# Patient Record
Sex: Male | Born: 1985
Health system: Southern US, Community
[De-identification: ages and names within clinical notes are randomized; demographics above are authoritative.]

## PROBLEM LIST (undated history)

## (undated) DIAGNOSIS — E669 Obesity, unspecified: Secondary | ICD-10-CM

## (undated) DIAGNOSIS — I10 Essential (primary) hypertension: Secondary | ICD-10-CM

---

## 2011-06-16 ENCOUNTER — Emergency Department (HOSPITAL_COMMUNITY)
Admission: EM | Admit: 2011-06-16 | Discharge: 2011-06-16 | Disposition: A | Discharge status: Home / Self Care | Payer: PRIVATE HEALTH INSURANCE | Attending: Emergency Medicine | Admitting: Emergency Medicine

## 2011-06-16 DIAGNOSIS — R51 Headache: Secondary | ICD-10-CM | POA: Insufficient documentation

## 2011-06-16 DIAGNOSIS — R509 Fever, unspecified: Secondary | ICD-10-CM | POA: Insufficient documentation

## 2011-06-16 DIAGNOSIS — J351 Hypertrophy of tonsils: Secondary | ICD-10-CM | POA: Insufficient documentation

## 2011-06-16 DIAGNOSIS — H9209 Otalgia, unspecified ear: Secondary | ICD-10-CM | POA: Insufficient documentation

## 2011-06-16 DIAGNOSIS — R110 Nausea: Secondary | ICD-10-CM | POA: Insufficient documentation

## 2011-06-16 DIAGNOSIS — E669 Obesity, unspecified: Secondary | ICD-10-CM | POA: Insufficient documentation

## 2011-06-16 DIAGNOSIS — R070 Pain in throat: Secondary | ICD-10-CM | POA: Insufficient documentation

## 2011-06-16 DIAGNOSIS — Z79899 Other long term (current) drug therapy: Secondary | ICD-10-CM | POA: Insufficient documentation

## 2011-06-16 DIAGNOSIS — R599 Enlarged lymph nodes, unspecified: Secondary | ICD-10-CM | POA: Insufficient documentation

## 2011-06-16 LAB — RAPID STREP SCREEN (MED CTR MEBANE ONLY): Streptococcus, Group A Screen (Direct): NEGATIVE

## 2013-02-01 ENCOUNTER — Emergency Department (HOSPITAL_COMMUNITY)
Admission: EM | Admit: 2013-02-01 | Discharge: 2013-02-02 | Disposition: A | Discharge status: Home / Self Care | Payer: 59 | Attending: Emergency Medicine | Admitting: Emergency Medicine

## 2013-02-01 ENCOUNTER — Encounter (HOSPITAL_COMMUNITY): Payer: Self-pay | Admitting: Emergency Medicine

## 2013-02-01 DIAGNOSIS — R319 Hematuria, unspecified: Secondary | ICD-10-CM

## 2013-02-01 DIAGNOSIS — F17200 Nicotine dependence, unspecified, uncomplicated: Secondary | ICD-10-CM | POA: Insufficient documentation

## 2013-02-01 LAB — URINE MICROSCOPIC-ADD ON

## 2013-02-01 LAB — URINALYSIS, ROUTINE W REFLEX MICROSCOPIC
Leukocytes, UA: NEGATIVE
Protein, ur: 30 mg/dL — AB
Urobilinogen, UA: 1 mg/dL (ref 0.0–1.0)
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
## 2013-02-02 MED ORDER — CIPROFLOXACIN HCL 500 MG PO TABS: 500.0000 mg | ORAL_TABLET | Freq: Two times a day (BID) | ORAL | Status: DC

## 2013-02-02 MED ORDER — CIPROFLOXACIN HCL 500 MG PO TABS
500.0000 mg | ORAL_TABLET | Freq: Once | ORAL | Status: AC
  Administered 2013-02-02: 500 mg via ORAL
  Filled 2013-02-02: qty 1

## 2013-02-02 NOTE — ED Provider Notes (Signed)
History     CSN: [id number]  Arrival date & time 02/01/13  2143   First MD Initiated Contact with Patient 02/02/13 0008      Chief Complaint  Patient presents with  . Hematuria    (Consider location/radiation/quality/duration/timing/severity/associated sxs/prior treatment) Patient is a 27 y.o. male presenting with hematuria. The history is provided by the patient (pt complains of blood in his urine). No language interpreter was used.  Hematuria This is a new problem. The current episode started 3 to 5 hours ago. The problem occurs constantly. The problem has not changed since onset.Pertinent negatives include no chest pain, no abdominal pain and no headaches. Nothing aggravates the symptoms. Nothing relieves the symptoms.    History reviewed. No pertinent past medical history.  History reviewed. No pertinent past surgical history.  No family history on file.  History  Substance Use Topics  . Smoking status: Current Every Day Smoker  . Smokeless tobacco: Not on file  . Alcohol Use: Yes      Review of Systems  Constitutional: Negative for appetite change and fatigue.  HENT: Negative for congestion, sinus pressure and ear discharge.   Eyes: Negative for discharge.  Respiratory: Negative for cough.   Cardiovascular: Negative for chest pain.  Gastrointestinal: Negative for abdominal pain and diarrhea.  Genitourinary: Positive for hematuria. Negative for frequency.  Musculoskeletal: Negative for back pain.  Skin: Negative for rash.  Neurological: Negative for seizures and headaches.  Psychiatric/Behavioral: Negative for hallucinations.    Allergies  Aspirin  Home Medications   Current Outpatient Rx  Name  Route  Sig  Dispense  Refill  . ciprofloxacin (CIPRO) 500 MG tablet   Oral   Take 1 tablet (500 mg total) by mouth 2 (two) times daily. One po bid x 7 days   14 tablet   0     BP 186/79  Pulse 83  Temp(Src) 99.5 F (37.5 C) (Oral)  Resp 16  SpO2  98%  Physical Exam  Constitutional: He is oriented to person, place, and time. He appears well-developed.  HENT:  Head: Normocephalic.  Eyes: Conjunctivae and EOM are normal. No scleral icterus.  Neck: Neck supple. No thyromegaly present.  Cardiovascular: Normal rate and regular rhythm.  Exam reveals no gallop and no friction rub.   No murmur heard. Pulmonary/Chest: No stridor. He has no wheezes. He has no rales. He exhibits no tenderness.  Abdominal: He exhibits no distension. There is no tenderness. There is no rebound.  Musculoskeletal: Normal range of motion. He exhibits no edema.  Lymphadenopathy:    He has no cervical adenopathy.  Neurological: He is oriented to person, place, and time. Coordination normal.  Skin: No rash noted. No erythema.  Psychiatric: He has a normal mood and affect. His behavior is normal.    ED Course  Procedures (including critical care time)  Labs Reviewed  URINALYSIS, ROUTINE W REFLEX MICROSCOPIC - Abnormal; Notable for the following:    APPearance HAZY (*)    Hgb urine dipstick LARGE (*)    Ketones, ur 15 (*)    Protein, ur 30 (*)    All other components within normal limits  URINE CULTURE  URINE MICROSCOPIC-ADD ON   No results found.   1. Hematuria       MDM          Benny Lennert, MD 02/02/13 [phone]

## 2013-02-03 LAB — URINE CULTURE

## 2013-02-04 ENCOUNTER — Telehealth (HOSPITAL_COMMUNITY): Payer: Self-pay | Admitting: Emergency Medicine

## 2013-02-04 NOTE — ED Notes (Signed)
Patient has +Urine culture. °

## 2013-02-04 NOTE — ED Notes (Signed)
+  Urine. Patient given Cipro. No sensitivities listed. Chart sent to EDP office for review. °

## 2013-02-06 NOTE — ED Notes (Signed)
Chart returned from EDP office . No abx needed per Neta Ehlers

## 2013-07-14 ENCOUNTER — Encounter (HOSPITAL_COMMUNITY): Payer: Self-pay | Admitting: Emergency Medicine

## 2013-07-14 ENCOUNTER — Emergency Department (HOSPITAL_COMMUNITY)
Admission: EM | Admit: 2013-07-14 | Discharge: 2013-07-14 | Disposition: A | Discharge status: Home / Self Care | Payer: 59 | Source: Home / Self Care | Attending: Family Medicine | Admitting: Family Medicine

## 2013-07-14 DIAGNOSIS — F1010 Alcohol abuse, uncomplicated: Secondary | ICD-10-CM
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
## 2013-07-14 NOTE — ED Provider Notes (Signed)
Medical screening examination/treatment/procedure(s) were performed by non-physician practitioner and as supervising physician I was immediately available for consultation/collaboration.  Leslee Home, M.D.  Reuben Likes, MD 07/14/13 [phone]

## 2013-07-14 NOTE — ED Notes (Signed)
Dr. Mat Carne has seen pt and was asked to d/c pt since nurse was busy.

## 2013-07-14 NOTE — ED Notes (Signed)
Pt. was given a Rx. To get UDS. Pt. works for [employer] and can get it done there for free.
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
## 2016-02-17 DIAGNOSIS — I10 Essential (primary) hypertension: Secondary | ICD-10-CM | POA: Insufficient documentation

## 2017-04-02 ENCOUNTER — Encounter (HOSPITAL_COMMUNITY): Payer: Self-pay | Admitting: Emergency Medicine

## 2017-04-02 ENCOUNTER — Ambulatory Visit (HOSPITAL_COMMUNITY)
Admission: EM | Admit: 2017-04-02 | Discharge: 2017-04-02 | Disposition: A | Discharge status: Home / Self Care | Payer: BLUE CROSS/BLUE SHIELD | Attending: Family Medicine | Admitting: Family Medicine

## 2017-04-02 DIAGNOSIS — T2691XA Corrosion of right eye and adnexa, part unspecified, initial encounter: Secondary | ICD-10-CM

## 2017-04-02 DIAGNOSIS — Z77098 Contact with and (suspected) exposure to other hazardous, chiefly nonmedicinal, chemicals: Secondary | ICD-10-CM | POA: Diagnosis not present

## 2017-04-02 HISTORY — DX: Essential (primary) hypertension: I10
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
## 2017-04-02 NOTE — Discharge Instructions (Signed)
Monitor closely over the next 24 hours. If you experience significant eye swelling, eye pain, and/or visual changes please return here or to the Emergency Department promptly.
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
## 2017-08-11 DIAGNOSIS — F4322 Adjustment disorder with anxiety: Secondary | ICD-10-CM | POA: Insufficient documentation

## 2018-09-12 DIAGNOSIS — G4733 Obstructive sleep apnea (adult) (pediatric): Secondary | ICD-10-CM | POA: Insufficient documentation

## 2018-09-12 DIAGNOSIS — Z6841 Body Mass Index (BMI) 40.0 and over, adult: Secondary | ICD-10-CM | POA: Insufficient documentation

## 2018-12-15 DIAGNOSIS — E8881 Metabolic syndrome: Secondary | ICD-10-CM | POA: Insufficient documentation

## 2019-05-27 ENCOUNTER — Ambulatory Visit (HOSPITAL_COMMUNITY)
Admission: EM | Admit: 2019-05-27 | Discharge: 2019-05-27 | Disposition: A | Discharge status: Home / Self Care | Payer: BC Managed Care – PPO | Attending: Family Medicine | Admitting: Family Medicine

## 2019-05-27 ENCOUNTER — Other Ambulatory Visit: Payer: Self-pay

## 2019-05-27 ENCOUNTER — Encounter (HOSPITAL_COMMUNITY): Payer: Self-pay

## 2019-05-27 DIAGNOSIS — S91111A Laceration without foreign body of right great toe without damage to nail, initial encounter: Secondary | ICD-10-CM

## 2019-05-27 MED ORDER — DICLOFENAC SODIUM 50 MG PO TBEC: 50.0000 mg | DELAYED_RELEASE_TABLET | Freq: Two times a day (BID) | ORAL | 0 refills | Status: DC

## 2019-05-27 MED ORDER — TETANUS-DIPHTH-ACELL PERTUSSIS 5-2.5-18.5 LF-MCG/0.5 IM SUSP
0.5000 mL | Freq: Once | INTRAMUSCULAR | Status: AC
  Administered 2019-05-27: 0.5 mL via INTRAMUSCULAR

## 2019-05-27 MED ORDER — TETANUS-DIPHTH-ACELL PERTUSSIS 5-2.5-18.5 LF-MCG/0.5 IM SUSP
INTRAMUSCULAR | Status: AC
  Filled 2019-05-27: qty 0.5

## 2019-05-27 NOTE — Discharge Instructions (Addendum)
Tetanus updated today. 4 sutures placed. You can remove current dressing in 24 hours. Keep wound clean and dry. You can clean gently with soap and water. Do not soak area in water. Monitor for spreading redness, increased warmth, increased swelling, fever, follow up for reevaluation needed. Otherwise follow up in 8 days for suture removal.

## 2019-05-27 NOTE — ED Triage Notes (Signed)
Pt states he was cutting grass and his foot got caught in the blade and he cut is toes. ( right foot ). Pt was around someone with Covid. Pt was tested and it was negative for Covid.

## 2019-05-27 NOTE — ED Provider Notes (Signed)
[place]    CSN: [id number] Arrival date & time: 05/27/19  1041      History   Chief Complaint Chief Complaint  Patient presents with  . Laceration    HPI James Allen is a 33 y.o. male.   33 year old male comes in for laceration to right great toe earlier today.  States he was cutting grass, he was trying to kick something out of the way when his foot got caught on the blade.  Area is bleeding.  He has some numbness to the tip of his toe.  Area with increased swelling.  Bleeding is controlled with pressure.  Unknown last tetanus.     Past Medical History:  Diagnosis Date  . Hypertension     There are no active problems to display for this patient.   History reviewed. No pertinent surgical history.     Home Medications    Prior to Admission medications   Medication Sig Start Date End Date Taking? Authorizing Provider  OVER THE COUNTER MEDICATION Patient takes a 5 mg blood pressure medicine .  Starts with an "A"    [provider]    Family History History reviewed. No pertinent family history.  Social History Social History   Tobacco Use  . Smoking status: Former Research scientist (life sciences)  . Smokeless tobacco: Never Used  Substance Use Topics  . Alcohol use: Yes  . Drug use: No     Allergies   Aspirin   Review of Systems Review of Systems  Reason unable to perform ROS: See HPI as above.     Physical Exam Triage Vital Signs ED Triage Vitals  Enc Vitals Group     BP 05/27/19 1120 (!) 164/83     Pulse Rate 05/27/19 1120 94     Resp 05/27/19 1120 20     Temp 05/27/19 1120 98.1 F (36.7 C)     Temp Source 05/27/19 1120 Oral     SpO2 05/27/19 1120 100 %     Weight 05/27/19 1116 (!) 365 lb (165.6 kg)     Height --      Head Circumference --      Peak Flow --      Pain Score 05/27/19 1116 10     Pain Loc --      Pain Edu? --      Excl. in [place]? --    No data found.  Updated Vital Signs BP (!) 164/83 (BP Location: Right Arm)    Pulse 94   Temp 98.1 F (36.7 C) (Oral)   Resp 20   Wt (!) 365 lb (165.6 kg)   SpO2 100%   Physical Exam Constitutional:      General: He is not in acute distress.    Appearance: He is well-developed. He is not diaphoretic.  HENT:     Head: Normocephalic and atraumatic.  Eyes:     Conjunctiva/sclera: Conjunctivae normal.     Pupils: Pupils are equal, round, and reactive to light.  Pulmonary:     Effort: Pulmonary effort is normal. No respiratory distress.  Musculoskeletal:     Comments: 2.5 cm laceration to the right great distal toe without nail involvement.  Bleeding controlled with pressure.  Diffuse swelling of the toe.  Numbness superior to the laceration.  Cap refill less than 2 seconds.  Neurological:     Mental Status: He is alert and oriented to person, place, and time.        UC Treatments / Results  Labs (all labs ordered are listed, but only abnormal results are displayed) Labs Reviewed - No data to display  EKG   Radiology No results found.  Procedures Laceration Repair  Date/Time: 05/27/2019 12:24 PM Performed by: Ok Edwards, PA-C Authorized by: Rosemarie Ax, MD   Consent:    Consent obtained:  Verbal   Consent given by:  Patient   Risks discussed:  Pain, poor cosmetic result, poor wound healing, infection and nerve damage   Alternatives discussed:  No treatment and referral Anesthesia (see MAR for exact dosages):    Anesthesia method:  Local infiltration   Local anesthetic:  Lidocaine 2% w/o epi Laceration details:    Location:  Toe   Toe location:  R big toe   Length (cm):  2.5   Depth (mm):  3 Repair type:    Repair type:  Simple Pre-procedure details:    Preparation:  Patient was prepped and draped in usual sterile fashion Exploration:    Hemostasis achieved with:  Direct pressure   Wound exploration: wound explored through full range of motion and entire depth of wound probed and visualized     Contaminated: no   Treatment:     Area cleansed with:  Hibiclens   Amount of cleaning:  Extensive   Irrigation solution:  Sterile water   Irrigation method:  Pressure wash   Visualized foreign bodies/material removed: no   Skin repair:    Repair method:  Sutures   Suture size:  4-0   Suture material:  Prolene   Suture technique:  Simple interrupted   Number of sutures:  4 Approximation:    Approximation:  Close Post-procedure details:    Dressing:  Antibiotic ointment and bulky dressing   Patient tolerance of procedure:  Tolerated well, no immediate complications   (including critical care time)  Medications Ordered in UC Medications  Tdap (BOOSTRIX) injection 0.5 mL (has no administration in time range)    Initial Impression / Assessment and Plan / UC Course  I have reviewed the triage vital signs and the nursing notes.  Pertinent labs & imaging results that were available during my care of the patient were reviewed by me and considered in my medical decision making (see chart for details).    Tetanus updated. Patient tolerated procedure well. 4 sutures placed. Wound care instructions given. Return precautions given. Otherwise, follow up in 8 days for suture removal. Patient expresses understanding and agrees to plan.   Final Clinical Impressions(s) / UC Diagnoses   Final diagnoses:  Laceration of right great toe without foreign body present or damage to nail, initial encounter    ED Prescriptions    None       Ok Edwards, PA-C 05/27/19 1225

## 2019-06-03 ENCOUNTER — Other Ambulatory Visit: Payer: Self-pay

## 2019-06-03 ENCOUNTER — Ambulatory Visit (HOSPITAL_COMMUNITY)
Admission: EM | Admit: 2019-06-03 | Discharge: 2019-06-03 | Disposition: A | Discharge status: Home / Self Care | Payer: BC Managed Care – PPO

## 2019-06-03 ENCOUNTER — Encounter (HOSPITAL_COMMUNITY): Payer: Self-pay | Admitting: Emergency Medicine

## 2019-06-03 DIAGNOSIS — Z4802 Encounter for removal of sutures: Secondary | ICD-10-CM

## 2019-06-03 NOTE — ED Triage Notes (Signed)
Pt here for suture removal to right great toe; 4 sutures removed and wound intact

## 2019-06-07 ENCOUNTER — Emergency Department (HOSPITAL_COMMUNITY): Payer: BC Managed Care – PPO

## 2019-06-07 ENCOUNTER — Other Ambulatory Visit: Payer: Self-pay

## 2019-06-07 ENCOUNTER — Emergency Department (HOSPITAL_COMMUNITY)
Admission: EM | Admit: 2019-06-07 | Discharge: 2019-06-07 | Disposition: A | Discharge status: Home / Self Care | Payer: BC Managed Care – PPO | Attending: Emergency Medicine | Admitting: Emergency Medicine

## 2019-06-07 ENCOUNTER — Encounter (HOSPITAL_COMMUNITY): Payer: Self-pay | Admitting: *Deleted

## 2019-06-07 DIAGNOSIS — M546 Pain in thoracic spine: Secondary | ICD-10-CM | POA: Diagnosis not present

## 2019-06-07 DIAGNOSIS — K219 Gastro-esophageal reflux disease without esophagitis: Secondary | ICD-10-CM | POA: Diagnosis not present

## 2019-06-07 DIAGNOSIS — R0789 Other chest pain: Secondary | ICD-10-CM | POA: Insufficient documentation

## 2019-06-07 DIAGNOSIS — Z87891 Personal history of nicotine dependence: Secondary | ICD-10-CM | POA: Insufficient documentation

## 2019-06-07 DIAGNOSIS — Z79899 Other long term (current) drug therapy: Secondary | ICD-10-CM | POA: Insufficient documentation

## 2019-06-07 DIAGNOSIS — I10 Essential (primary) hypertension: Secondary | ICD-10-CM | POA: Insufficient documentation

## 2019-06-07 HISTORY — DX: Obesity, unspecified: E66.9

## 2019-06-07 LAB — CBC
HCT: 42.9 % (ref 39.0–52.0)
Hemoglobin: 13.7 g/dL (ref 13.0–17.0)
MCH: 29.6 pg (ref 26.0–34.0)
MCHC: 31.9 g/dL (ref 30.0–36.0)
MCV: 92.7 fL (ref 80.0–100.0)
Platelets: 409 10*3/uL — ABNORMAL HIGH (ref 150–400)
RBC: 4.63 MIL/uL (ref 4.22–5.81)
RDW: 11.8 % (ref 11.5–15.5)
WBC: 10.9 10*3/uL — ABNORMAL HIGH (ref 4.0–10.5)
nRBC: 0 % (ref 0.0–0.2)

## 2019-06-07 LAB — BASIC METABOLIC PANEL
Anion gap: 11 (ref 5–15)
BUN: 8 mg/dL (ref 6–20)
CO2: 26 mmol/L (ref 22–32)
Calcium: 9.1 mg/dL (ref 8.9–10.3)
Chloride: 98 mmol/L (ref 98–111)
Creatinine, Ser: 0.96 mg/dL (ref 0.61–1.24)
GFR calc Af Amer: >60 mL/min (ref >60)
GFR calc non Af Amer: >60 mL/min (ref >60)
Glucose, Bld: 94 mg/dL (ref 70–99)
Potassium: 4.2 mmol/L (ref 3.5–5.1)
Sodium: 135 mmol/L (ref 135–145)

## 2019-06-07 LAB — TROPONIN I (HIGH SENSITIVITY)
Troponin I (High Sensitivity): 4 ng/L (ref <18)
Troponin I (High Sensitivity): 4 ng/L (ref <18)

## 2019-06-07 LAB — D-DIMER, QUANTITATIVE (NOT AT ARMC): D-Dimer, Quant: 0.71 ug/mL-FEU — ABNORMAL HIGH (ref 0.00–0.50)

## 2019-06-07 MED ORDER — FAMOTIDINE 20 MG PO TABS: 20.0000 mg | ORAL_TABLET | Freq: Two times a day (BID) | ORAL | 0 refills | Status: AC

## 2019-06-07 MED ORDER — IOHEXOL 350 MG/ML SOLN
100.0000 mL | Freq: Once | INTRAVENOUS | Status: AC | PRN
  Administered 2019-06-07: 100 mL via INTRAVENOUS

## 2019-06-07 MED ORDER — LIDOCAINE 5 % EX PTCH: 1.0000 | MEDICATED_PATCH | CUTANEOUS | 0 refills | Status: AC

## 2019-06-07 MED ORDER — METHOCARBAMOL 500 MG PO TABS: 500.0000 mg | ORAL_TABLET | Freq: Two times a day (BID) | ORAL | 0 refills | Status: AC

## 2019-06-07 MED ORDER — SODIUM CHLORIDE 0.9% FLUSH
3.0000 mL | Freq: Once | INTRAVENOUS | Status: AC
  Administered 2019-06-07: 3 mL via INTRAVENOUS

## 2019-06-07 MED ORDER — METHOCARBAMOL 500 MG PO TABS
500.0000 mg | ORAL_TABLET | Freq: Once | ORAL | Status: AC
  Administered 2019-06-07: 500 mg via ORAL
  Filled 2019-06-07: qty 1

## 2019-06-07 MED ORDER — ACETAMINOPHEN 500 MG PO TABS
1000.0000 mg | ORAL_TABLET | Freq: Once | ORAL | Status: AC
  Administered 2019-06-07: 1000 mg via ORAL
  Filled 2019-06-07: qty 2

## 2019-06-07 NOTE — ED Triage Notes (Signed)
Pt reports pain started as back pain, no injury. Began feeling like he had indigestion last night and now has chest pain, sob when moving. No acute distress noted at triage.

## 2019-06-07 NOTE — Discharge Instructions (Signed)
Take the medications as prescribed.  Follow up with PCP for symptoms unresolved over the next 3 days.  Return for any new or worsening symptoms such as worsening chest pain, shortness of breath, coughing up blood, chest pain radiating into your back.

## 2019-06-07 NOTE — ED Provider Notes (Signed)
[facility] EMERGENCY DEPARTMENT Provider Note   CSN: [id number] Arrival date & time: 06/07/19  1248    History   Chief Complaint Chief Complaint  Patient presents with  . Back Pain  . Chest Pain   HPI James Allen is a 33 y.o. male with past medical history significant for hypertension, obesity who presents for evaluation of back pain.  Patient states beginning yesterday he developed intermittent pain to his right upper back.  Patient states occasionally when he moves the pain will radiate into his chest.  Patient states pain will occasionally happen when he takes a deep breath.  Pain is sharp in nature.  He has not taken anything for his pain.  Denies any recent injury or trauma.  Pain mostly located to inferior right scapula.  Denies prior history of PE or DVT.  States he only has chest pain when he has this back pain.  He denies chest pain radiating into his back.  Denies fever, chills, nausea, vomiting, hemoptysis, cough, abdominal pain, diarrhea, dysuria.  Have have been intermittent since they started.  Has additional aggravating or alleviating factors.  He denies any exertional chest pain, diaphoresis, nausea, vomiting, lightheaded or dizziness.  No prior history of diabetes, hypertriglyceridemia, family history of early MI.  He did have a leg injury 1 month ago however he has been ambulatory since the incident and did not require surgery.  He denies any tenderness to posterior calves, redness, swelling, warmth to calves.  Rates his current pain a 4/10 and is intermittent in nature.  History obtained from patient and past medical records.  No interpreter was used.      HPI  Past Medical History:  Diagnosis Date  . Hypertension   . Obese     There are no active problems to display for this patient.   History reviewed. No pertinent surgical history.      Home Medications    Prior to Admission medications   Medication Sig Start Date End Date Taking?  Authorizing Provider  amLODipine (NORVASC) 10 MG tablet Take 10 mg by mouth daily. 09/12/18  Yes [provider]  lisinopril (ZESTRIL) 10 MG tablet Take 10 mg by mouth daily. 05/28/19  Yes [provider]  Phentermine HCl 8 MG TABS Take 8 mg by mouth See admin instructions. Take 8mg  daily around noon, may increase to 8mg  twice daily on the weekends. 04/12/19  Yes [provider]  topiramate (TOPAMAX) 25 MG tablet Take 25 mg by mouth daily as needed.  04/11/19  Yes [provider]  famotidine (PEPCID) 20 MG tablet Take 1 tablet (20 mg total) by mouth 2 (two) times daily. 06/07/19   Ura Yingling A, PA-C  lidocaine (LIDODERM) 5 % Place 1 patch onto the skin daily. Remove & Discard patch within 12 hours or as directed by MD 06/07/19   Daisy Mcneel A, PA-C  methocarbamol (ROBAXIN) 500 MG tablet Take 1 tablet (500 mg total) by mouth 2 (two) times daily. 06/07/19   Leane Loring A, PA-C    Family History History reviewed. No pertinent family history.  Social History Social History   Tobacco Use  . Smoking status: Former Research scientist (life sciences)  . Smokeless tobacco: Never Used  Substance Use Topics  . Alcohol use: Yes  . Drug use: No     Allergies   Aspirin   Review of Systems Review of Systems  Constitutional: Negative.   HENT: Negative.   Respiratory: Negative.   Cardiovascular: Positive for chest  pain. Negative for palpitations and leg swelling.  Gastrointestinal: Negative.   Genitourinary: Negative.   Musculoskeletal: Positive for gait problem.  Skin: Negative.   Neurological: Negative for dizziness, tremors, syncope, speech difficulty, weakness, light-headedness, numbness and headaches.  All other systems reviewed and are negative.    Physical Exam Updated Vital Signs BP 127/88 (BP Location: Left Wrist)   Pulse 69   Temp 98.9 F (37.2 C) (Oral)   Resp 18   SpO2 99%   Physical Exam Vitals signs and nursing note reviewed.  Constitutional:       General: He is not in acute distress.    Appearance: He is not ill-appearing, toxic-appearing or diaphoretic.  HENT:     Head: Normocephalic and atraumatic.     Jaw: There is normal jaw occlusion.     Nose: Nose normal.  Eyes:     Extraocular Movements: Extraocular movements intact.  Neck:     Musculoskeletal: Full passive range of motion without pain, normal range of motion and neck supple.     Vascular: No carotid bruit or JVD.     Trachea: Trachea and phonation normal.     Meningeal: Brudzinski's sign and Kernig's sign absent.  Cardiovascular:     Rate and Rhythm: Normal rate.     Pulses: Normal pulses.          Radial pulses are 2+ on the right side and 2+ on the left side.       Posterior tibial pulses are 2+ on the right side and 2+ on the left side.     Heart sounds: Normal heart sounds.  Pulmonary:     Effort: Pulmonary effort is normal.     Breath sounds: Normal breath sounds and air entry.     Comments: Speaks in  full sentences without difficulty. Chest:     Chest wall: No deformity, swelling, tenderness, crepitus or edema.  Abdominal:     General: Bowel sounds are normal.     Palpations: Abdomen is soft.     Tenderness: There is no abdominal tenderness. There is no guarding or rebound.     Comments: Soft, nontender without rebound or guarding.  Negative Murphy sign.  Musculoskeletal: Normal range of motion.       Back:     Right lower leg: He exhibits no tenderness. No edema.     Left lower leg: He exhibits no tenderness. No edema.     Comments: Moves all 4 extremities without difficulty.  Homans sign negative.  Nontender bilateral calves without swelling.  Tenderness to right inferior portion of scapula.  Able to reproduce pain.  No crepitus, step-offs, swelling, deformity.  No midline spinal tenderness palpation or deformity.  Feet:     Right foot:     Skin integrity: Skin integrity normal.     Left foot:     Skin integrity: Skin integrity normal.  Skin:     General: Skin is warm.     Capillary Refill: Capillary refill takes less than 2 seconds.     Comments: No rashes or lesions.  No edema, erythema, ecchymosis or warmth.  Neurological:     General: No focal deficit present.     Mental Status: He is alert and oriented to person, place, and time.     Comments: Cranial nerves II through XII grossly intact.  No facial droop.  Phonation normal.    ED Treatments / Results  Labs (all labs ordered are listed, but only abnormal results are displayed)  Labs Reviewed  CBC - Abnormal; Notable for the following components:      Result Value   WBC 10.9 (*)    Platelets 409 (*)    All other components within normal limits  D-DIMER, QUANTITATIVE (NOT AT [facility]) - Abnormal; Notable for the following components:   D-Dimer, Quant 0.71 (*)    All other components within normal limits  BASIC METABOLIC PANEL  TROPONIN I (HIGH SENSITIVITY)  TROPONIN I (HIGH SENSITIVITY)    EKG EKG Interpretation  Date/Time:  Thursday June 07 2019 12:52:15 EDT Ventricular Rate:  76 PR Interval:  128 QRS Duration: 84 QT Interval:  374 QTC Calculation: 420 R Axis:   64 Text Interpretation:  Normal sinus rhythm T wave abnormality, consider inferior ischemia Abnormal ECG Confirmed by Gerlene Fee [phone]) on 06/07/2019 12:58:38 PM   Radiology Dg Chest 2 View  Result Date: 06/07/2019 CLINICAL DATA:  Chest pain and back pain. EXAM: CHEST - 2 VIEW COMPARISON:  October 21, 2017 FINDINGS: Cardiomediastinal silhouette is normal. Mediastinal contours appear intact. There is no evidence of focal airspace consolidation, pleural effusion or pneumothorax. Osseous structures are without acute abnormality. Soft tissues are grossly normal. IMPRESSION: No active cardiopulmonary disease. Electronically Signed   By: Fidela Salisbury M.D.   On: 06/07/2019 13:17   Ct Angio Chest Pe W/cm &/or Wo Cm  Result Date: 06/07/2019 CLINICAL DATA:  Chest pain suspected. Positive D-dimer. Back  pain. Shortness of breath. EXAM: CT ANGIOGRAPHY CHEST WITH CONTRAST TECHNIQUE: Multidetector CT imaging of the chest was performed using the standard protocol during bolus administration of intravenous contrast. Multiplanar CT image reconstructions and MIPs were obtained to evaluate the vascular anatomy. CONTRAST:  163mL OMNIPAQUE IOHEXOL 350 MG/ML SOLN COMPARISON:  Plain film earlier today.  No prior CT. FINDINGS: Cardiovascular: The quality of this exam for evaluation of pulmonary embolism is poor to moderate. Primary limitation is patient body habitus. The bolus is also centered in the SVC. No central or lobar embolism identified. Normal aortic caliber. Tortuous thoracic aorta. Mild cardiomegaly, without pericardial effusion. Mediastinum/Nodes: No mediastinal or hilar adenopathy. Subtle fluid level in the esophagus on 28/8. Lungs/Pleura: No pleural fluid. Areas of bibasilar scarring or subsegmental atelectasis, worse on the right. Upper Abdomen: Normal imaged portions of the liver, spleen, stomach, pancreas, adrenal glands, left kidney. Musculoskeletal: Age advanced thoracic spondylosis. Review of the MIP images confirms the above findings. IMPRESSION: 1. No large/central pulmonary embolism, with limitations above. 2. No other explanation for patient's symptoms. 3. Esophageal air fluid level suggests dysmotility or gastroesophageal reflux. Electronically Signed   By: Abigail Miyamoto M.D.   On: 06/07/2019 19:24    Procedures Procedures (including critical care time)  Medications Ordered in ED Medications  sodium chloride flush (NS) 0.9 % injection 3 mL (3 mLs Intravenous Given 06/07/19 1739)  methocarbamol (ROBAXIN) tablet 500 mg (500 mg Oral Given 06/07/19 1602)  acetaminophen (TYLENOL) tablet 1,000 mg (1,000 mg Oral Given 06/07/19 1603)  iohexol (OMNIPAQUE) 350 MG/ML injection 100 mL (100 mLs Intravenous Contrast Given 06/07/19 1851)    Initial Impression / Assessment and Plan / ED Course  I have reviewed  the triage vital signs and the nursing notes.  Pertinent labs & imaging results that were available during my care of the patient were reviewed by me and considered in my medical decision making (see chart for details).    1525: Patient not in room to assess.  33 year old male appears otherwise well presents for evaluation of back pain which began yesterday.  Pain worse with movement and deep breathing.  Patient states when he moves quickly or takes a deep breath he gets a shooting pain to his right upper chest.  Pain reproducible to palpation to right inferior scapula.  Neuromusculoskeletal exam.  He is neurovascularly intact.  He has had some pleuritic chest pain without hemoptysis.  Recent injury to right lower extremity however has been mobile since the injury and did not require surgery.  No evidence of DVT on exam.  Denies any chest pain or abdominal pain radiating to his back. Heart score-2 ( Non specific repol, HTN, obesity) Labs obtained from triage.  Labs and imaging personally reviewed: CBC with mild leukocytosis at [id number] Metabolic panel without electrolyte, renal or liver abnormality Delta troponin negative D-dimer mildly elevated 0.71 CTA negative for PE, there is possible reflux Plain film chest without infiltrates, cardiomegaly, COPD, pneumothorax EKG without ST/T changes.  No STEMI.  On reevaluation patient without any pain.  Pain continues to be reproducible to palpation.  High suspicion for musculoskeletal sprain or strain.  I have low suspicion for ACS, PE, dissection, myocarditis, pericarditis, [place].  He does have symptoms of acid reflux however does not want GI cocktail.  Will DC home with Pepcid.  Symptomatic management for musculoskeletal pain.  Patient to return for any new worsening symptoms.  He has been hemodynamically stable in the ED.  Tolerating p.o. intake and ambulatory that difficulty. NO UR sx to suggest COVID.  Patient is to be discharged with recommendation  to follow up with PCP in regards to today's hospital visit. Chest pain is not likely of cardiac or pulmonary etiology d/t presentation, PERC negative, VSS, no tracheal deviation, no JVD or new murmur, RRR, breath sounds equal bilaterally, EKG without acute abnormalities, negative troponin, and negative CXR. Pt has been advised to return to the ED if CP becomes exertional, associated with diaphoresis or nausea, radiates to left jaw/arm, worsens or becomes concerning in any way. Pt appears reliable for follow up and is agreeable to discharge.      Final Clinical Impressions(s) / ED Diagnoses   Final diagnoses:  Acute right-sided thoracic back pain  Chest wall pain  Gastroesophageal reflux disease, esophagitis presence not specified    ED Discharge Orders         Ordered    lidocaine (LIDODERM) 5 %  Every 24 hours     06/07/19 1952    famotidine (PEPCID) 20 MG tablet  2 times daily     06/07/19 1952    methocarbamol (ROBAXIN) 500 MG tablet  2 times daily     06/07/19 1952           Maryana Pittmon A, PA-C 06/07/19 Daphine Deutscher, MD 06/09/19 1949

## 2019-06-07 NOTE — ED Notes (Signed)
Patient verbalizes understanding of discharge instructions. Opportunity for questioning and answers were provided. Armband removed by staff, pt discharged from ED ambulatory.   

## 2020-07-11 IMAGING — CT CT ANGIO CHEST
2 of 6 series · 18 of 46 positions shown · IV contrast (omnipaque)
Comparison: Plain film earlier today.  No prior CT.

CLINICAL DATA: Chest pain suspected. Positive D-dimer. Back pain.
Shortness of breath.

EXAM:
CT ANGIOGRAPHY CHEST WITH CONTRAST
TECHNIQUE: Multidetector CT imaging of the chest was performed using the
standard protocol during bolus administration of intravenous
contrast. Multiplanar CT image reconstructions and MIPs were
obtained to evaluate the vascular anatomy.
CONTRAST:  100mL OMNIPAQUE IOHEXOL 350 MG/ML SOLN

[Series 9: thins · axial · 0.80mm/px · z∈[+1001,+1230]mm · 15 of 251 slices shown]
[im 11/251  lung]
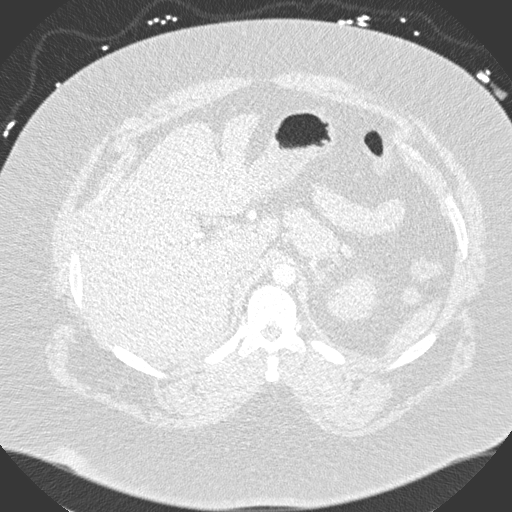
[im 33/251  soft-tissue]
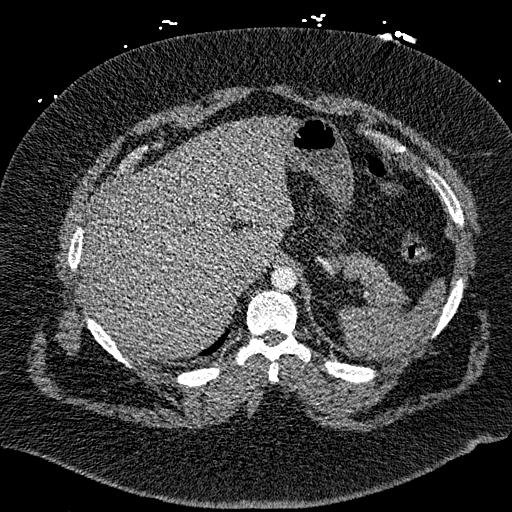
[im 44/251  lung]
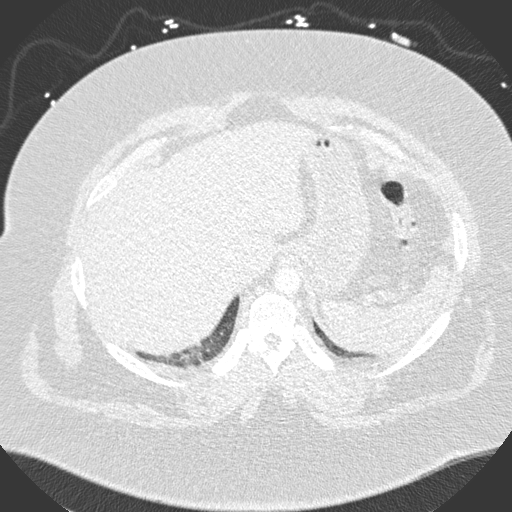
[im 66/251  soft-tissue]
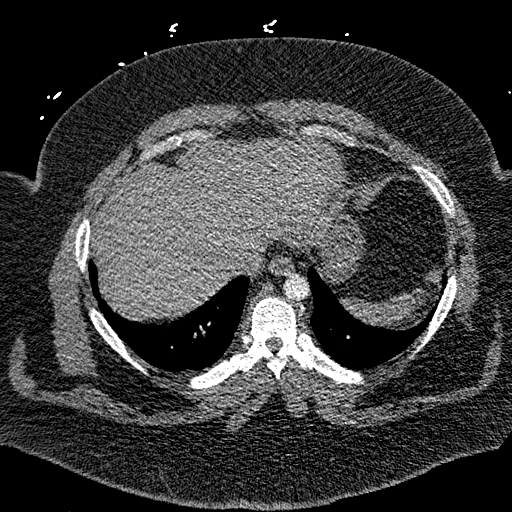
[im 77/251  lung]
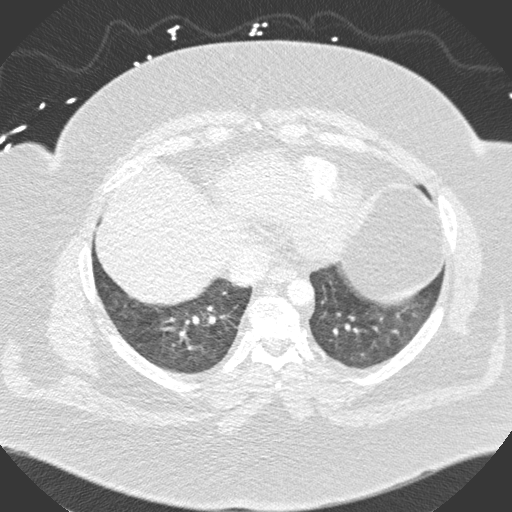
[im 98/251  soft-tissue]
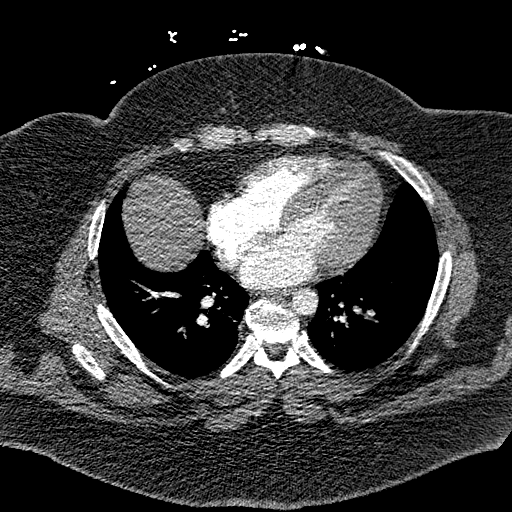
[im 109/251  lung]
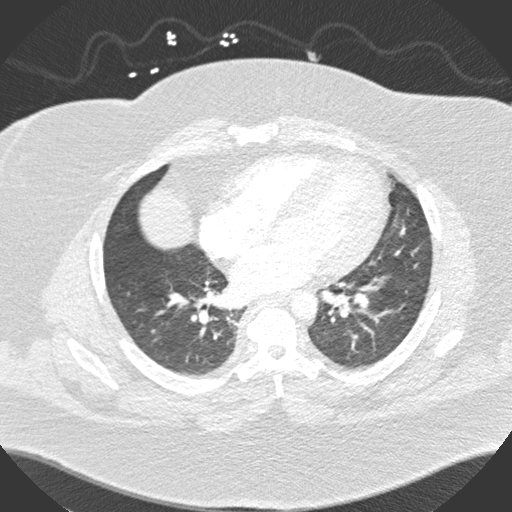
[im 131/251  soft-tissue]
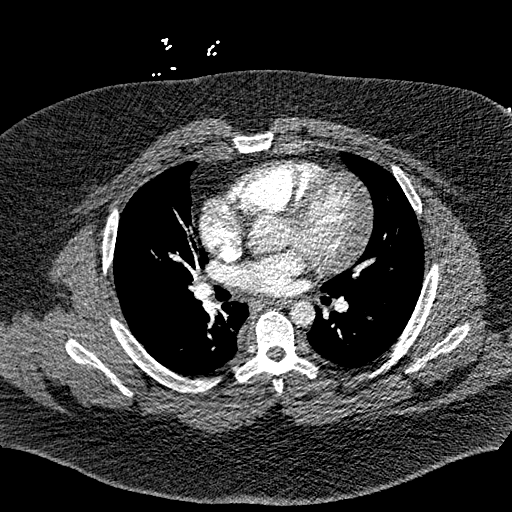
[im 142/251  lung]
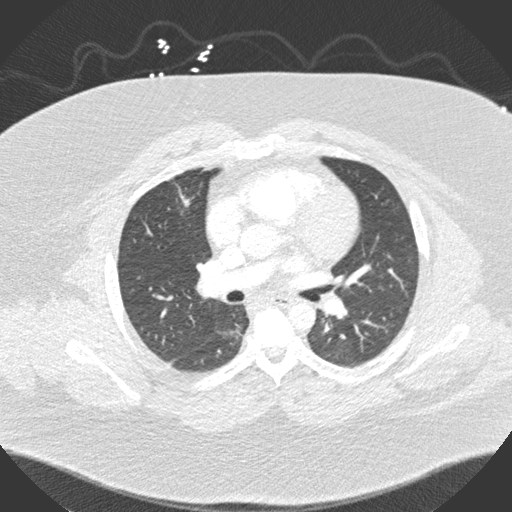
[im 153/251  soft-tissue]
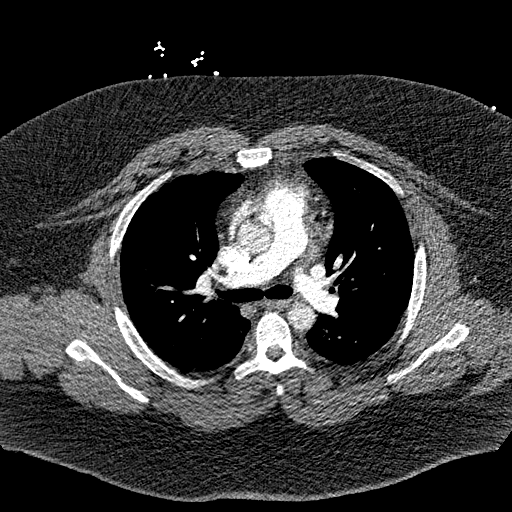
[im 174/251  lung]
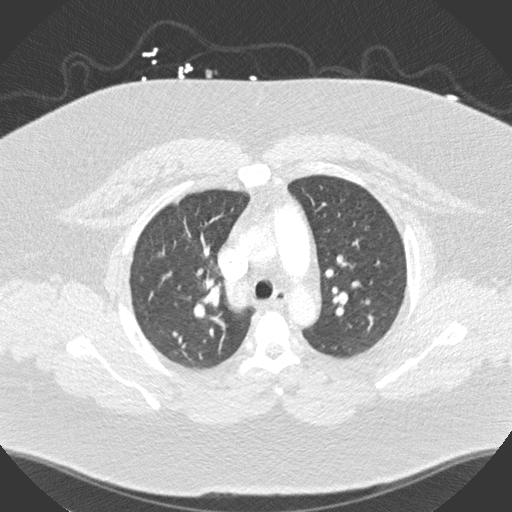
[im 185/251  soft-tissue]
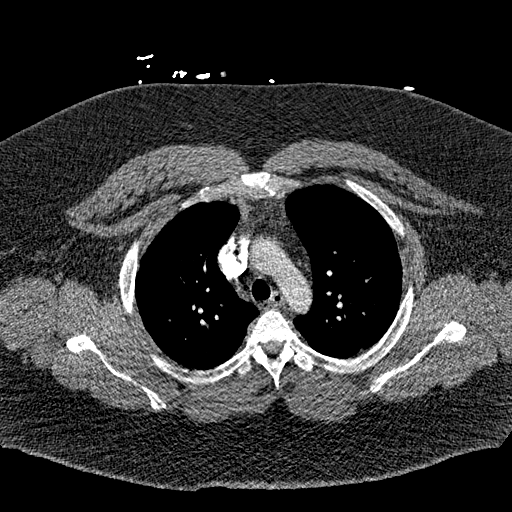
[im 207/251  lung]
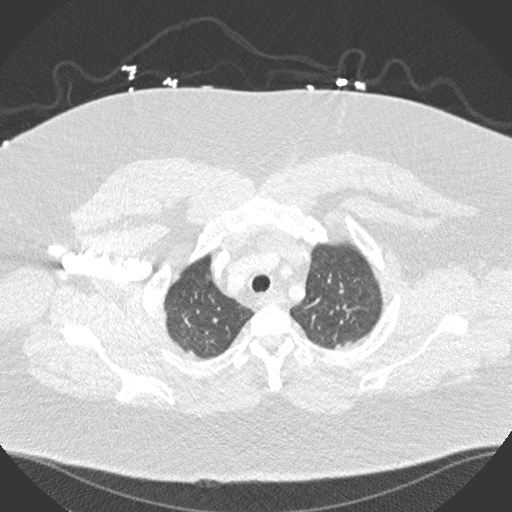
[im 218/251  soft-tissue]
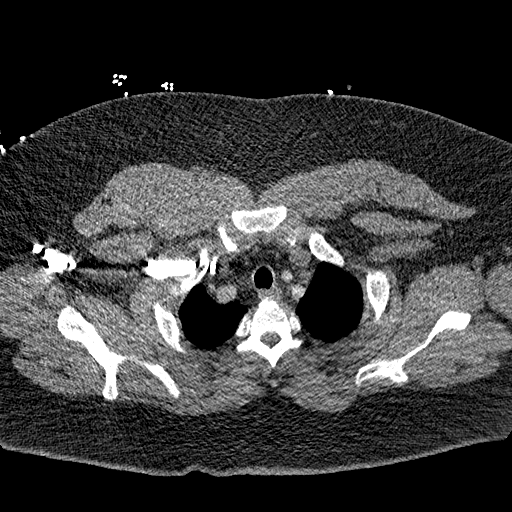
[im 240/251  lung]
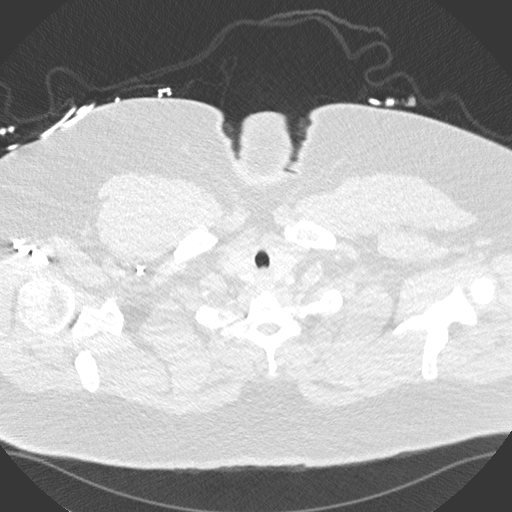

[Series 11: coronal mpr · coronal · 0.50mm/px · 3 of 170 slices shown]
[im 43/170  soft-tissue]
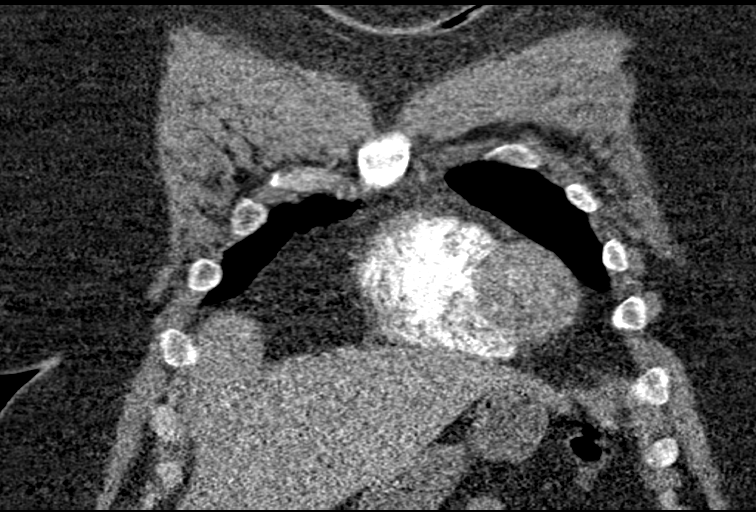
[im 85/170  soft-tissue]
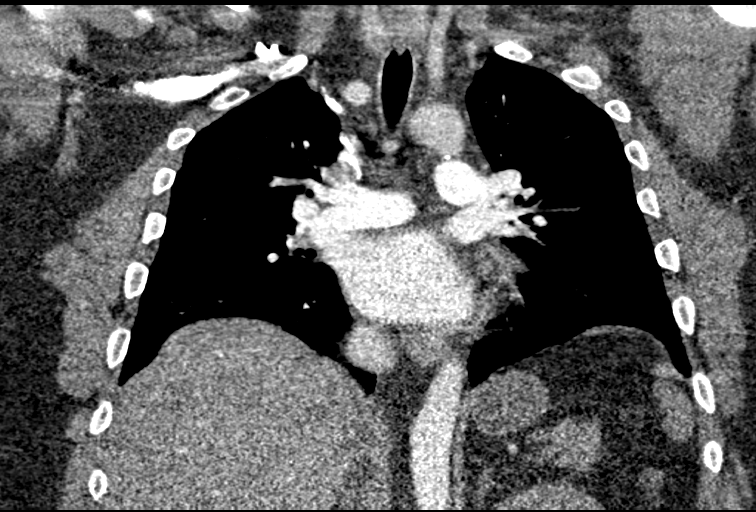
[im 127/170  soft-tissue]
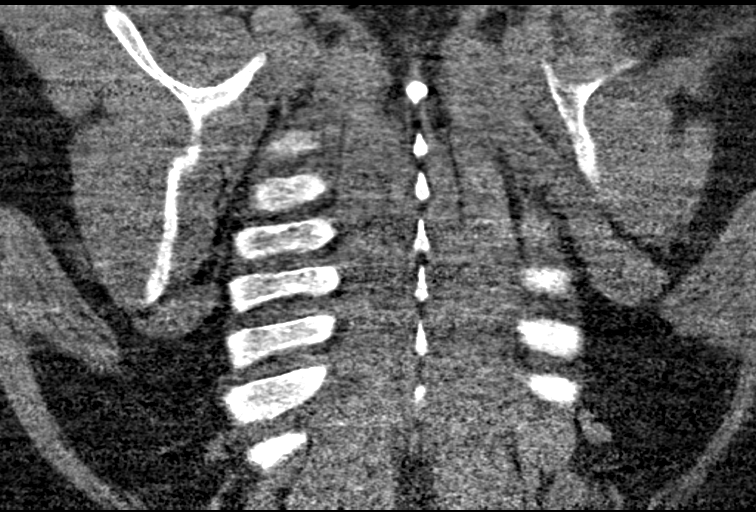

[18 of 46 positions shown; findings below may reference images not displayed]

FINDINGS: Cardiovascular: The quality of this exam for evaluation of pulmonary
embolism is poor to moderate. Primary limitation is patient body
habitus. The bolus is also centered in the SVC. No central or lobar
embolism identified.

Normal aortic caliber. Tortuous thoracic aorta. Mild cardiomegaly,
without pericardial effusion.

Mediastinum/Nodes: No mediastinal or hilar adenopathy. Subtle fluid
level in the esophagus on [DATE].

Lungs/Pleura: No pleural fluid. Areas of bibasilar scarring or
subsegmental atelectasis, worse on the right.

Upper Abdomen: Normal imaged portions of the liver, spleen, stomach,
pancreas, adrenal glands, left kidney.

Musculoskeletal: Age advanced thoracic spondylosis.

Review of the MIP images confirms the above findings.
IMPRESSION: 1. No large/central pulmonary embolism, with limitations above.
2. No other explanation for patient's symptoms.
3. Esophageal air fluid level suggests dysmotility or
gastroesophageal reflux.

## 2020-07-29 DIAGNOSIS — I10 Essential (primary) hypertension: Secondary | ICD-10-CM | POA: Diagnosis not present

## 2020-09-11 ENCOUNTER — Other Ambulatory Visit (HOSPITAL_COMMUNITY): Payer: Self-pay | Admitting: Nurse Practitioner

## 2020-09-24 MED FILL — LISINOPRIL 40 MG TABS: 40 | 30 days supply | Qty: 30 | Fill #0

## 2020-09-24 MED FILL — AMLODIPINE BESYLATE 10 MG T: 10 | 30 days supply | Qty: 30 | Fill #0

## 2020-10-16 ENCOUNTER — Other Ambulatory Visit (HOSPITAL_COMMUNITY): Payer: Self-pay | Admitting: Nurse Practitioner

## 2020-10-16 DIAGNOSIS — R42 Dizziness and giddiness: Secondary | ICD-10-CM | POA: Diagnosis not present

## 2020-10-16 DIAGNOSIS — K219 Gastro-esophageal reflux disease without esophagitis: Secondary | ICD-10-CM | POA: Insufficient documentation

## 2020-10-16 DIAGNOSIS — I10 Essential (primary) hypertension: Secondary | ICD-10-CM | POA: Diagnosis not present

## 2020-10-16 MED FILL — HYDROCHLOROTHIAZIDE 25 MG T: 25 | 30 days supply | Qty: 30 | Fill #0

## 2020-10-16 MED FILL — LISINOPRIL 20 MG TABLET: 20 | 30 days supply | Qty: 30 | Fill #0

## 2020-10-20 MED FILL — AMLODIPINE BESYLATE 10 MG T: 10 | 30 days supply | Qty: 30 | Fill #0

## 2020-11-10 ENCOUNTER — Other Ambulatory Visit (HOSPITAL_COMMUNITY): Payer: Self-pay | Admitting: Nurse Practitioner

## 2020-11-10 MED FILL — LISINOPRIL 40 MG TABS: 40 | 30 days supply | Qty: 30 | Fill #0

## 2020-11-14 DIAGNOSIS — R42 Dizziness and giddiness: Secondary | ICD-10-CM | POA: Diagnosis not present

## 2020-11-14 DIAGNOSIS — I10 Essential (primary) hypertension: Secondary | ICD-10-CM | POA: Diagnosis not present

## 2020-11-21 MED FILL — AMLODIPINE BESYLATE 10 MG T: 10 | 30 days supply | Qty: 30 | Fill #1

## 2020-11-21 MED FILL — LISINOPRIL 40 MG TABS: 40 | 30 days supply | Qty: 30 | Fill #0

## 2020-11-24 ENCOUNTER — Other Ambulatory Visit (HOSPITAL_COMMUNITY): Payer: Self-pay | Admitting: Nurse Practitioner

## 2020-12-29 MED FILL — AMLODIPINE BESYLATE 10 MG T: 10 | 90 days supply | Qty: 90 | Fill #0

## 2021-01-21 ENCOUNTER — Other Ambulatory Visit (HOSPITAL_COMMUNITY): Payer: Self-pay

## 2021-01-21 MED FILL — Lisinopril Tab 20 MG: ORAL | 30 days supply | Qty: 30 | Fill #0 | Status: AC

## 2021-02-26 ENCOUNTER — Other Ambulatory Visit (HOSPITAL_COMMUNITY): Payer: Self-pay

## 2021-02-26 MED ORDER — MELOXICAM 15 MG PO TABS
1.0000 | ORAL_TABLET | Freq: Every day | ORAL | 0 refills | Status: AC
  Filled 2021-02-26 – 2021-03-03 (×2): qty 90, 90d supply, fill #0

## 2021-02-26 MED FILL — Lisinopril Tab 20 MG: ORAL | 90 days supply | Qty: 90 | Fill #0 | Status: AC

## 2021-03-03 ENCOUNTER — Other Ambulatory Visit (HOSPITAL_COMMUNITY): Payer: Self-pay

## 2021-04-14 ENCOUNTER — Other Ambulatory Visit (HOSPITAL_COMMUNITY): Payer: Self-pay

## 2021-04-14 MED FILL — Amlodipine Besylate Tab 10 MG (Base Equivalent): ORAL | 90 days supply | Qty: 90 | Fill #0 | Status: AC

## 2021-04-16 ENCOUNTER — Other Ambulatory Visit (HOSPITAL_COMMUNITY): Payer: Self-pay

## 2021-04-16 DIAGNOSIS — U071 COVID-19: Secondary | ICD-10-CM | POA: Diagnosis not present

## 2021-04-16 MED ORDER — BENZONATATE 200 MG PO CAPS
200.0000 mg | ORAL_CAPSULE | Freq: Three times a day (TID) | ORAL | 1 refills | Status: AC
  Filled 2021-04-16: qty 30, 10d supply, fill #0

## 2021-04-16 MED ORDER — FLUTICASONE PROPIONATE 50 MCG/ACT NA SUSP
1.0000 | Freq: Every day | NASAL | 0 refills | Status: AC
  Filled 2021-04-16: qty 16, 60d supply, fill #0

## 2021-05-14 ENCOUNTER — Other Ambulatory Visit (HOSPITAL_COMMUNITY): Payer: Self-pay

## 2021-05-14 DIAGNOSIS — M25562 Pain in left knee: Secondary | ICD-10-CM | POA: Diagnosis not present

## 2021-05-14 DIAGNOSIS — M25561 Pain in right knee: Secondary | ICD-10-CM | POA: Diagnosis not present

## 2021-05-14 DIAGNOSIS — I10 Essential (primary) hypertension: Secondary | ICD-10-CM | POA: Diagnosis not present

## 2021-05-14 DIAGNOSIS — G8929 Other chronic pain: Secondary | ICD-10-CM | POA: Diagnosis not present

## 2021-05-14 MED ORDER — AMLODIPINE BESYLATE 10 MG PO TABS
10.0000 mg | ORAL_TABLET | Freq: Every day | ORAL | 1 refills | Status: DC
  Filled 2021-05-14 – 2021-07-20 (×2): qty 90, 90d supply, fill #0
  Filled 2021-10-27: qty 90, 90d supply, fill #1

## 2021-05-14 MED ORDER — LISINOPRIL 20 MG PO TABS
20.0000 mg | ORAL_TABLET | Freq: Every day | ORAL | 1 refills | Status: DC
  Filled 2021-05-14 – 2021-06-12 (×2): qty 90, 90d supply, fill #0

## 2021-05-22 ENCOUNTER — Other Ambulatory Visit (HOSPITAL_COMMUNITY): Payer: Self-pay

## 2021-06-09 ENCOUNTER — Other Ambulatory Visit (HOSPITAL_COMMUNITY): Payer: Self-pay

## 2021-06-09 MED ORDER — IBUPROFEN 600 MG PO TABS
600.0000 mg | ORAL_TABLET | Freq: Four times a day (QID) | ORAL | 0 refills | Status: AC | PRN
  Filled 2021-06-09: qty 30, 8d supply, fill #0

## 2021-06-12 ENCOUNTER — Other Ambulatory Visit (HOSPITAL_COMMUNITY): Payer: Self-pay

## 2021-07-20 ENCOUNTER — Other Ambulatory Visit (HOSPITAL_COMMUNITY): Payer: Self-pay

## 2021-08-26 ENCOUNTER — Ambulatory Visit (INDEPENDENT_AMBULATORY_CARE_PROVIDER_SITE_OTHER): Payer: 59 | Admitting: Podiatry

## 2021-08-26 ENCOUNTER — Ambulatory Visit (INDEPENDENT_AMBULATORY_CARE_PROVIDER_SITE_OTHER): Payer: 59

## 2021-08-26 ENCOUNTER — Other Ambulatory Visit: Payer: Self-pay

## 2021-08-26 DIAGNOSIS — D2370 Other benign neoplasm of skin of unspecified lower limb, including hip: Secondary | ICD-10-CM

## 2021-08-26 DIAGNOSIS — M79671 Pain in right foot: Secondary | ICD-10-CM | POA: Diagnosis not present

## 2021-08-26 NOTE — Progress Notes (Signed)
   Subjective: 35 y.o. male presenting to the office today for evaluation of a symptomatic skin lesion to the right fifth toe.  Patient [place] pain and tenderness to the right fifth toe.  He does have a history of a corn that developed to the right fifth toe.  He believes [place] exacerbated his symptoms.  He presents for further treatment and evaluation.  He has applied corn callus remover in the past but recently this has not provided any relief of symptoms for him   Past Medical History:  Diagnosis Date   Hypertension    Obese      Objective:  Physical Exam General: Alert and oriented x3 in no acute distress  Dermatology: Hyperkeratotic lesion(s) present on the dorsal aspect overlying the PIPJ right fifth toe. Pain on palpation with a central nucleated core noted. Skin is warm, dry and supple bilateral lower extremities. Negative for open lesions or macerations.  Vascular: Palpable pedal pulses bilaterally. No edema or erythema noted. Capillary refill within normal limits.  Neurological: Epicritic and protective threshold grossly intact bilaterally.   Musculoskeletal Exam: Pain on palpation at the keratotic lesion(s) noted. Range of motion within normal limits bilateral. Muscle strength 5/5 in all groups bilateral.  Assessment: 1.  Symptomatic corn/benign skin lesion right fifth toe   Plan of Care:  1. Patient evaluated 2. Excisional debridement of keratoic lesion(s) using a chisel blade was performed without incident.  3.  Recommend OTC corn callus remover as needed 4. Patient is to return to the clinic PRN.   Edrick Kins, DPM [facility]  Dr. Edrick Kins, DPM    [address], [place] [address]                Office [phone]  Fax [phone]

## 2021-09-15 ENCOUNTER — Other Ambulatory Visit (HOSPITAL_COMMUNITY): Payer: Self-pay

## 2021-09-15 MED FILL — Lisinopril Tab 20 MG: ORAL | 90 days supply | Qty: 90 | Fill #1 | Status: CN

## 2021-09-23 ENCOUNTER — Other Ambulatory Visit (HOSPITAL_COMMUNITY): Payer: Self-pay

## 2021-09-24 ENCOUNTER — Other Ambulatory Visit (HOSPITAL_COMMUNITY): Payer: Self-pay

## 2021-09-24 MED FILL — Lisinopril Tab 20 MG: ORAL | 90 days supply | Qty: 90 | Fill #1 | Status: AC

## 2021-09-30 DIAGNOSIS — G4733 Obstructive sleep apnea (adult) (pediatric): Secondary | ICD-10-CM | POA: Diagnosis not present

## 2021-10-27 ENCOUNTER — Other Ambulatory Visit (HOSPITAL_COMMUNITY): Payer: Self-pay

## 2021-11-02 ENCOUNTER — Other Ambulatory Visit (HOSPITAL_COMMUNITY): Payer: Self-pay

## 2021-12-23 ENCOUNTER — Other Ambulatory Visit: Payer: Self-pay

## 2021-12-23 ENCOUNTER — Ambulatory Visit (INDEPENDENT_AMBULATORY_CARE_PROVIDER_SITE_OTHER): Payer: BC Managed Care – PPO

## 2021-12-23 ENCOUNTER — Ambulatory Visit: Payer: BC Managed Care – PPO | Admitting: Podiatry

## 2021-12-23 DIAGNOSIS — S90851A Superficial foreign body, right foot, initial encounter: Secondary | ICD-10-CM | POA: Diagnosis not present

## 2021-12-23 DIAGNOSIS — R52 Pain, unspecified: Secondary | ICD-10-CM | POA: Diagnosis not present

## 2021-12-31 ENCOUNTER — Other Ambulatory Visit (HOSPITAL_COMMUNITY): Payer: Self-pay

## 2021-12-31 MED ORDER — AMLODIPINE BESYLATE 10 MG PO TABS
10.0000 mg | ORAL_TABLET | Freq: Every day | ORAL | 1 refills | Status: AC
  Filled 2021-12-31: qty 90, 90d supply, fill #0
  Filled 2022-04-23: qty 90, 90d supply, fill #1

## 2021-12-31 MED ORDER — LISINOPRIL 40 MG PO TABS
40.0000 mg | ORAL_TABLET | Freq: Every day | ORAL | 0 refills | Status: DC
  Filled 2021-12-31: qty 90, 90d supply, fill #0

## 2022-01-03 NOTE — Progress Notes (Signed)
? ?  HPI: 36 y.o. male presenting today for new complaint of pain regarding the right foot.  Patient states that for the last month he has had pain and tenderness to the right plantar heel.  He feels that there is something digging in his heel.  It is painful to walk.  He has not done anything for treatment. ? ?Past Medical History:  ?Diagnosis Date  ? Hypertension   ? Obese   ? ? ?No past surgical history on file. ? ?Allergies  ?Allergen Reactions  ? Aspirin Nausea And Vomiting  ? Aspirin   ?  tolerates Ibuprofen  ? Shrimp Extract Allergy Skin Test Diarrhea  ? ?  ?Physical Exam: ?General: The patient is alert and oriented x3 in no acute distress. ? ?Dermatology: Skin is warm, dry and supple bilateral lower extremities. Negative for open lesions or macerations.  There is a hyperkeratotic skin lesion to the plantar aspect of the heel with associated tenderness to palpation.  No drainage.  No malodor.  Periwound intact. ? ?After debridement of the callus tissue there was an underlying wound extending into the subcutaneous tissue and a 7 mm glass shard was removed from the subcutaneous tissue of the heel.  Patient felt immediate relief.  Fortunately the patient was able to tolerate this without the use of anesthesia ? ?Vascular: Palpable pedal pulses bilaterally. Capillary refill within normal limits.  Negative for any significant edema or erythema ? ?Neurological: Light touch and protective threshold grossly intact ? ?Musculoskeletal Exam: No pedal deformities noted.  Associated tenderness to palpation of the heel ? ?Radiographic Exam:  ?Normal osseous mineralization. Joint spaces preserved. No fracture/dislocation/boney destruction.   ? ?Assessment: ?1.  Foreign body in subcutaneous tissue, glass shard 7 mm right heel; initial encounter ? ? ?Plan of Care:  ?1. Patient evaluated. X-Rays reviewed.  ?2.  Debridement of the area was performed and the glass shard was removed in toto.  The patient felt relief after the  foreign body was removed. ?3.  Light dressing applied.  This should heal routinely ?4.  Return to clinic as needed ? ?  ?  ?Edrick Kins, DPM ?[facility] ? ?Dr. Edrick Kins, DPM  ?  ?[address]                                        ?[place],  [address]                ?Office [phone]  ?Fax [phone] ? ? ? ? ?

## 2022-01-29 ENCOUNTER — Other Ambulatory Visit (HOSPITAL_COMMUNITY): Payer: Self-pay

## 2022-01-29 MED ORDER — AMLODIPINE BESYLATE 10 MG PO TABS
10.0000 mg | ORAL_TABLET | Freq: Every day | ORAL | 1 refills | Status: AC
  Filled 2022-01-29 – 2022-07-30 (×2): qty 90, 90d supply, fill #0
  Filled 2022-11-10: qty 90, 90d supply, fill #1

## 2022-01-29 MED ORDER — LISINOPRIL 40 MG PO TABS
40.0000 mg | ORAL_TABLET | Freq: Every day | ORAL | 1 refills | Status: DC
  Filled 2022-01-29 – 2022-04-23 (×2): qty 90, 90d supply, fill #0
  Filled 2022-07-30: qty 90, 90d supply, fill #1

## 2022-04-02 ENCOUNTER — Other Ambulatory Visit (HOSPITAL_COMMUNITY): Payer: Self-pay

## 2022-04-02 MED ORDER — MELOXICAM 15 MG PO TABS
15.0000 mg | ORAL_TABLET | Freq: Every day | ORAL | 3 refills | Status: AC
  Filled 2022-04-02: qty 90, 90d supply, fill #0
  Filled 2022-07-30 – 2022-11-10 (×2): qty 90, 90d supply, fill #1

## 2022-04-23 ENCOUNTER — Other Ambulatory Visit (HOSPITAL_COMMUNITY): Payer: Self-pay

## 2022-07-30 ENCOUNTER — Other Ambulatory Visit (HOSPITAL_COMMUNITY): Payer: Self-pay

## 2022-11-10 ENCOUNTER — Other Ambulatory Visit: Payer: Self-pay

## 2022-11-10 ENCOUNTER — Other Ambulatory Visit (HOSPITAL_COMMUNITY): Payer: Self-pay

## 2022-11-10 MED ORDER — LISINOPRIL 40 MG PO TABS
40.0000 mg | ORAL_TABLET | Freq: Every day | ORAL | 0 refills | Status: AC
  Filled 2022-11-10: qty 90, 90d supply, fill #0

## 2023-02-25 ENCOUNTER — Other Ambulatory Visit (HOSPITAL_COMMUNITY): Payer: Self-pay

## 2023-02-25 MED ORDER — AMLODIPINE BESYLATE 10 MG PO TABS
10.0000 mg | ORAL_TABLET | Freq: Every day | ORAL | 0 refills | Status: DC
  Filled 2023-02-25: qty 90, 90d supply, fill #0

## 2023-02-25 MED ORDER — LISINOPRIL 40 MG PO TABS
40.0000 mg | ORAL_TABLET | Freq: Every day | ORAL | 0 refills | Status: DC
  Filled 2023-02-25: qty 90, 90d supply, fill #0

## 2023-03-02 ENCOUNTER — Other Ambulatory Visit (HOSPITAL_COMMUNITY): Payer: Self-pay

## 2023-03-04 ENCOUNTER — Other Ambulatory Visit (HOSPITAL_COMMUNITY): Payer: Self-pay

## 2023-04-05 ENCOUNTER — Other Ambulatory Visit (HOSPITAL_COMMUNITY): Payer: Self-pay

## 2023-04-05 MED ORDER — AMLODIPINE BESYLATE 10 MG PO TABS
10.0000 mg | ORAL_TABLET | Freq: Every day | ORAL | 1 refills | Status: DC
  Filled 2023-04-05 – 2023-06-03 (×2): qty 90, 90d supply, fill #0
  Filled 2023-09-06: qty 90, 90d supply, fill #1

## 2023-04-05 MED ORDER — LISINOPRIL 40 MG PO TABS
40.0000 mg | ORAL_TABLET | Freq: Every day | ORAL | 1 refills | Status: DC
  Filled 2023-04-05 – 2023-06-03 (×2): qty 90, 90d supply, fill #0
  Filled 2023-09-06: qty 90, 90d supply, fill #1

## 2023-06-03 ENCOUNTER — Other Ambulatory Visit (HOSPITAL_COMMUNITY): Payer: Self-pay

## 2023-07-08 ENCOUNTER — Other Ambulatory Visit (HOSPITAL_COMMUNITY): Payer: Self-pay

## 2023-07-11 ENCOUNTER — Other Ambulatory Visit (HOSPITAL_COMMUNITY): Payer: Self-pay

## 2023-07-14 ENCOUNTER — Other Ambulatory Visit (HOSPITAL_COMMUNITY): Payer: Self-pay

## 2023-07-14 MED ORDER — MELOXICAM 15 MG PO TABS
15.0000 mg | ORAL_TABLET | Freq: Every day | ORAL | 3 refills | Status: AC | PRN
Start: 2023-07-14 — End: ?
  Filled 2023-07-14: qty 30, 30d supply, fill #0
  Filled 2023-09-06: qty 30, 30d supply, fill #1

## 2023-09-06 ENCOUNTER — Other Ambulatory Visit (HOSPITAL_COMMUNITY): Payer: Self-pay

## 2023-09-12 ENCOUNTER — Other Ambulatory Visit (HOSPITAL_COMMUNITY): Payer: Self-pay

## 2023-09-12 ENCOUNTER — Other Ambulatory Visit: Payer: Self-pay

## 2023-09-12 ENCOUNTER — Encounter (HOSPITAL_BASED_OUTPATIENT_CLINIC_OR_DEPARTMENT_OTHER): Payer: Self-pay | Admitting: Emergency Medicine

## 2023-09-12 DIAGNOSIS — Z87891 Personal history of nicotine dependence: Secondary | ICD-10-CM | POA: Insufficient documentation

## 2023-09-12 DIAGNOSIS — I10 Essential (primary) hypertension: Secondary | ICD-10-CM | POA: Diagnosis not present

## 2023-09-12 DIAGNOSIS — S61311A Laceration without foreign body of left index finger with damage to nail, initial encounter: Secondary | ICD-10-CM | POA: Diagnosis not present

## 2023-09-12 DIAGNOSIS — Y93G3 Activity, cooking and baking: Secondary | ICD-10-CM | POA: Insufficient documentation

## 2023-09-12 DIAGNOSIS — W260XXA Contact with knife, initial encounter: Secondary | ICD-10-CM | POA: Insufficient documentation

## 2023-09-12 DIAGNOSIS — S6992XA Unspecified injury of left wrist, hand and finger(s), initial encounter: Secondary | ICD-10-CM | POA: Diagnosis present

## 2023-09-12 MED ORDER — ZEPBOUND 2.5 MG/0.5ML ~~LOC~~ SOAJ
2.5000 mg | SUBCUTANEOUS | 1 refills | Status: DC
  Filled 2023-09-12: qty 2, 28d supply, fill #0

## 2023-09-12 NOTE — ED Triage Notes (Signed)
Patient presents with laceration to left index finger. Reports accidentally cutting "nail tip" while slicing onions. Pressure dressing applied in triage. Bleeding controlled

## 2023-09-13 ENCOUNTER — Emergency Department (HOSPITAL_BASED_OUTPATIENT_CLINIC_OR_DEPARTMENT_OTHER)
Admission: EM | Admit: 2023-09-13 | Discharge: 2023-09-13 | Disposition: A | Discharge status: Home / Self Care | Payer: BC Managed Care – PPO | Attending: Emergency Medicine | Admitting: Emergency Medicine

## 2023-09-13 ENCOUNTER — Other Ambulatory Visit (HOSPITAL_COMMUNITY): Payer: Self-pay

## 2023-09-13 DIAGNOSIS — S61311A Laceration without foreign body of left index finger with damage to nail, initial encounter: Secondary | ICD-10-CM

## 2023-09-13 MED ORDER — LISDEXAMFETAMINE DIMESYLATE 30 MG PO CAPS
30.0000 mg | ORAL_CAPSULE | Freq: Every morning | ORAL | 0 refills | Status: AC
  Filled 2023-09-13: qty 30, 30d supply, fill #0

## 2023-09-13 NOTE — ED Notes (Signed)
Discharge instructions regarding home care of wound were reviewed. Pt was instructed to return with any new or worsening symptoms

## 2023-09-13 NOTE — ED Provider Notes (Signed)
[facility] Emergency Department Provider Note MRN:  [id number]  Arrival date & time: 09/13/23     Chief Complaint   finger laceration   History of Present Illness   James Allen is a 37 y.o. year-old male with a history of hypertension presenting to the ED with chief complaint of finger laceration.  Cut his left pointer finger with a new knife while cutting onions, could have a small portion of the tip of the finger off including a small portion of the fingernail.  Could not stop the bleeding at home.  No other injuries.  Review of Systems  A thorough review of systems was obtained and all systems are negative except as noted in the HPI and PMH.   Patient's Health History    Past Medical History:  Diagnosis Date   Hypertension    Obese     History reviewed. No pertinent surgical history.  History reviewed. No pertinent family history.  Social History   Socioeconomic History   Marital status: Single    Spouse name: Not on file   Number of children: Not on file   Years of education: Not on file   Highest education level: Not on file  Occupational History   Not on file  Tobacco Use   Smoking status: Former   Smokeless tobacco: Never  Substance and Sexual Activity   Alcohol use: Yes   Drug use: No   Sexual activity: Not on file  Other Topics Concern   Not on file  Social History Narrative   Not on file   Social Determinants of Health   Financial Resource Strain: Not on file  Food Insecurity: Not on file  Transportation Needs: Not on file  Physical Activity: Not on file  Stress: Not on file  Social Connections: Not on file  Intimate Partner Violence: Not on file     Physical Exam   Vitals:   09/12/23 2115 09/13/23 0018  BP: (!) 142/94 (!) 151/96  Pulse: 77 70  Resp: 20 18  Temp: 97.9 F (36.6 C)   SpO2: 99% 98%    CONSTITUTIONAL: Well-appearing, NAD NEURO/PSYCH:  Alert and oriented x 3, no focal deficits EYES:  eyes  equal and reactive ENT/NECK:  no LAD, no JVD CARDIO: Regular rate, well-perfused, normal S1 and S2 PULM:  CTAB no wheezing or rhonchi GI/GU:  non-distended, non-tender MSK/SPINE:  No gross deformities, no edema SKIN:  no rash, atraumatic   *Additional and/or pertinent findings included in MDM below  Diagnostic and Interventional Summary    EKG Interpretation Date/Time:    Ventricular Rate:    PR Interval:    QRS Duration:    QT Interval:    QTC Calculation:   R Axis:      Text Interpretation:         Labs Reviewed - No data to display  No orders to display    Medications - No data to display   Procedures  /  Critical Care .Laceration Repair  Date/Time: 09/13/2023 12:46 AM  Performed by: Sabas Sous, MD Authorized by: Sabas Sous, MD   Consent:    Consent obtained:  Verbal   Consent given by:  Patient   Risks, benefits, and alternatives were discussed: yes     Risks discussed:  Infection, need for additional repair, nerve damage, poor wound healing, poor cosmetic result, pain, retained foreign body, tendon damage and vascular damage Universal protocol:    Procedure explained and questions answered to  patient or proxy's satisfaction: yes     Immediately prior to procedure, a time out was called: yes     Patient identity confirmed:  Verbally with patient Anesthesia:    Anesthesia method:  None Laceration details:    Location:  Finger   Finger location:  L index finger   Length (cm):  1   Depth (mm):  2 Exploration:    Limited defect created (wound extended): no     Hemostasis achieved with:  Tourniquet   Wound exploration: wound explored through full range of motion and entire depth of wound visualized     Contaminated: no   Treatment:    Area cleansed with:  Saline   Amount of cleaning:  Standard Skin repair:    Repair method:  Tissue adhesive Approximation:    Laceration repair approximation: Unable to approximate due to missing tissue. Repair  type:    Repair type:  Simple Post-procedure details:    Dressing:  Bulky dressing   Procedure completion:  Tolerated well, no immediate complications   ED Course and Medical Decision Making  Initial Impression and Ddx Very distal tip laceration to the left pointer finger with small amount of missing distal nail and finger.  Not deep enough to worry about injury to the bone.  Still actively bleeding on my assessment, will need finger tourniquet and medical adhesive.  Past medical/surgical history that increases complexity of ED encounter: None, tetanus up-to-date  Interpretation of Diagnostics Laboratory and/or imaging options to aid in the diagnosis/care of the patient were considered.  After careful history and physical examination, it was determined that there was no indication for diagnostics at this time.  Patient Reassessment and Ultimate Disposition/Management     Discharge  Patient management required discussion with the following services or consulting groups:  None  Complexity of Problems Addressed Acute complicated illness or Injury  Additional Data Reviewed and Analyzed Further history obtained from: None  Additional Factors Impacting ED Encounter Risk Minor Procedures  Elmer Sow. Pilar Plate, MD [facility] Emergency Medicine [facility] [email or web address]  Final Clinical Impressions(s) / ED Diagnoses     ICD-10-CM   1. Laceration of left index finger without foreign body with damage to nail, initial encounter  S61.311A       ED Discharge Orders     None        Discharge Instructions Discussed with and Provided to Patient:    Discharge Instructions      You were evaluated in the Emergency Department and after careful evaluation, we did not find any emergent condition requiring admission or further testing in the hospital.  Your exam/testing today is overall reassuring.  We repaired your laceration here in the emergency department with  medical adhesive.  This will wear away with time, try to keep it as dry as you can over the next 3 to 5 days.  Please return to the Emergency Department if you experience any worsening of your condition.   Thank you for allowing [place] to be a part of your care.      Sabas Sous, MD 09/13/23 [phone]

## 2023-09-13 NOTE — Discharge Instructions (Signed)
You were evaluated in the Emergency Department and after careful evaluation, we did not find any emergent condition requiring admission or further testing in the hospital.  Your exam/testing today is overall reassuring.  We repaired your laceration here in the emergency department with medical adhesive.  This will wear away with time, try to keep it as dry as you can over the next 3 to 5 days.  Please return to the Emergency Department if you experience any worsening of your condition.   Thank you for allowing [place] to be a part of your care.

## 2023-09-13 NOTE — ED Notes (Signed)
MD at the bedside to apply dermabond to left index finger

## 2023-10-03 ENCOUNTER — Other Ambulatory Visit (HOSPITAL_COMMUNITY): Payer: Self-pay

## 2023-10-03 MED ORDER — LISDEXAMFETAMINE DIMESYLATE 30 MG PO CAPS
30.0000 mg | ORAL_CAPSULE | Freq: Every morning | ORAL | 0 refills | Status: DC
  Filled 2023-10-14 – 2023-10-18 (×2): qty 30, 30d supply, fill #0

## 2023-10-06 ENCOUNTER — Other Ambulatory Visit (HOSPITAL_COMMUNITY): Payer: Self-pay

## 2023-10-06 MED ORDER — LISINOPRIL 40 MG PO TABS
40.0000 mg | ORAL_TABLET | Freq: Every day | ORAL | 1 refills | Status: AC
  Filled 2023-10-06 – 2023-12-13 (×2): qty 90, 90d supply, fill #0

## 2023-10-06 MED ORDER — AMLODIPINE BESYLATE 10 MG PO TABS
10.0000 mg | ORAL_TABLET | Freq: Every day | ORAL | 1 refills | Status: AC
  Filled 2023-10-06 – 2023-12-13 (×2): qty 90, 90d supply, fill #0

## 2023-10-14 ENCOUNTER — Other Ambulatory Visit (HOSPITAL_COMMUNITY): Payer: Self-pay

## 2023-10-18 ENCOUNTER — Other Ambulatory Visit (HOSPITAL_COMMUNITY): Payer: Self-pay

## 2023-11-02 DIAGNOSIS — Z0000 Encounter for general adult medical examination without abnormal findings: Secondary | ICD-10-CM | POA: Diagnosis not present

## 2023-11-02 DIAGNOSIS — Z13220 Encounter for screening for lipoid disorders: Secondary | ICD-10-CM | POA: Diagnosis not present

## 2023-11-04 ENCOUNTER — Other Ambulatory Visit (HOSPITAL_COMMUNITY): Payer: Self-pay

## 2023-11-04 MED ORDER — ZEPBOUND 2.5 MG/0.5ML ~~LOC~~ SOAJ
2.5000 mg | SUBCUTANEOUS | 1 refills | Status: DC
  Filled 2023-11-04: qty 2, 28d supply, fill #0

## 2023-11-11 ENCOUNTER — Other Ambulatory Visit (HOSPITAL_COMMUNITY): Payer: Self-pay

## 2023-11-22 ENCOUNTER — Other Ambulatory Visit (HOSPITAL_COMMUNITY): Payer: Self-pay

## 2023-11-22 ENCOUNTER — Other Ambulatory Visit: Payer: Self-pay

## 2023-11-22 MED ORDER — LISDEXAMFETAMINE DIMESYLATE 30 MG PO CAPS
30.0000 mg | ORAL_CAPSULE | Freq: Every morning | ORAL | 0 refills | Status: AC
  Filled 2023-11-22: qty 30, 30d supply, fill #0

## 2023-11-23 ENCOUNTER — Other Ambulatory Visit (HOSPITAL_COMMUNITY): Payer: Self-pay

## 2023-12-09 DIAGNOSIS — Z713 Dietary counseling and surveillance: Secondary | ICD-10-CM | POA: Diagnosis not present

## 2023-12-09 DIAGNOSIS — Z6841 Body Mass Index (BMI) 40.0 and over, adult: Secondary | ICD-10-CM | POA: Diagnosis not present

## 2023-12-13 ENCOUNTER — Other Ambulatory Visit (HOSPITAL_COMMUNITY): Payer: Self-pay

## 2023-12-13 ENCOUNTER — Other Ambulatory Visit: Payer: Self-pay

## 2024-01-04 ENCOUNTER — Other Ambulatory Visit (HOSPITAL_COMMUNITY): Payer: Self-pay

## 2024-01-04 DIAGNOSIS — N5201 Erectile dysfunction due to arterial insufficiency: Secondary | ICD-10-CM | POA: Diagnosis not present

## 2024-01-04 DIAGNOSIS — N500 Atrophy of testis: Secondary | ICD-10-CM | POA: Diagnosis not present

## 2024-01-04 MED ORDER — SILDENAFIL CITRATE 20 MG PO TABS
20.0000 mg | ORAL_TABLET | ORAL | 3 refills | Status: AC
Start: 2024-01-04 — End: ?
  Filled 2024-01-04 (×2): qty 45, 9d supply, fill #0

## 2024-01-19 DIAGNOSIS — N5201 Erectile dysfunction due to arterial insufficiency: Secondary | ICD-10-CM | POA: Diagnosis not present

## 2024-02-13 DIAGNOSIS — E66813 Obesity, class 3: Secondary | ICD-10-CM | POA: Diagnosis not present

## 2024-02-13 DIAGNOSIS — Z713 Dietary counseling and surveillance: Secondary | ICD-10-CM | POA: Diagnosis not present

## 2024-02-13 DIAGNOSIS — Z6841 Body Mass Index (BMI) 40.0 and over, adult: Secondary | ICD-10-CM | POA: Diagnosis not present

## 2024-03-12 ENCOUNTER — Other Ambulatory Visit (HOSPITAL_COMMUNITY): Payer: Self-pay

## 2024-03-12 DIAGNOSIS — R632 Polyphagia: Secondary | ICD-10-CM | POA: Diagnosis not present

## 2024-03-12 DIAGNOSIS — G8929 Other chronic pain: Secondary | ICD-10-CM | POA: Diagnosis not present

## 2024-03-12 DIAGNOSIS — Z6841 Body Mass Index (BMI) 40.0 and over, adult: Secondary | ICD-10-CM | POA: Diagnosis not present

## 2024-03-12 DIAGNOSIS — I10 Essential (primary) hypertension: Secondary | ICD-10-CM | POA: Diagnosis not present

## 2024-03-12 DIAGNOSIS — M25561 Pain in right knee: Secondary | ICD-10-CM | POA: Diagnosis not present

## 2024-03-12 MED ORDER — MELOXICAM 15 MG PO TABS
15.0000 mg | ORAL_TABLET | Freq: Every day | ORAL | 0 refills | Status: DC | PRN
  Filled 2024-03-12: qty 30, 30d supply, fill #0

## 2024-03-12 MED ORDER — LISDEXAMFETAMINE DIMESYLATE 30 MG PO CAPS
30.0000 mg | ORAL_CAPSULE | Freq: Every morning | ORAL | 0 refills | Status: DC
  Filled 2024-03-12: qty 30, 30d supply, fill #0

## 2024-03-13 ENCOUNTER — Other Ambulatory Visit (HOSPITAL_COMMUNITY): Payer: Self-pay

## 2024-04-05 ENCOUNTER — Other Ambulatory Visit (HOSPITAL_COMMUNITY): Payer: Self-pay

## 2024-04-05 MED ORDER — AMLODIPINE BESYLATE 10 MG PO TABS
10.0000 mg | ORAL_TABLET | Freq: Every day | ORAL | 1 refills | Status: DC
  Filled 2024-04-05: qty 90, 90d supply, fill #0
  Filled 2024-07-09: qty 30, 30d supply, fill #1
  Filled 2024-08-14: qty 30, 30d supply, fill #2
  Filled 2024-09-14: qty 30, 30d supply, fill #3

## 2024-04-05 MED ORDER — LISINOPRIL 40 MG PO TABS
40.0000 mg | ORAL_TABLET | Freq: Every day | ORAL | 1 refills | Status: DC
  Filled 2024-04-05: qty 90, 90d supply, fill #0
  Filled 2024-07-09: qty 30, 30d supply, fill #1
  Filled 2024-08-14: qty 30, 30d supply, fill #2
  Filled 2024-09-14: qty 30, 30d supply, fill #3

## 2024-04-09 ENCOUNTER — Other Ambulatory Visit (HOSPITAL_COMMUNITY): Payer: Self-pay

## 2024-04-19 ENCOUNTER — Other Ambulatory Visit (HOSPITAL_COMMUNITY): Payer: Self-pay

## 2024-04-19 MED ORDER — LISDEXAMFETAMINE DIMESYLATE 30 MG PO CAPS
30.0000 mg | ORAL_CAPSULE | Freq: Every morning | ORAL | 0 refills | Status: DC
  Filled 2024-04-19: qty 30, 30d supply, fill #0

## 2024-04-25 ENCOUNTER — Other Ambulatory Visit (HOSPITAL_COMMUNITY): Payer: Self-pay

## 2024-04-25 MED ORDER — MELOXICAM 15 MG PO TABS
15.0000 mg | ORAL_TABLET | Freq: Every day | ORAL | 2 refills | Status: DC | PRN
  Filled 2024-04-25: qty 30, 30d supply, fill #0

## 2024-05-02 ENCOUNTER — Other Ambulatory Visit (HOSPITAL_COMMUNITY): Payer: Self-pay

## 2024-05-02 MED ORDER — ZEPBOUND 2.5 MG/0.5ML ~~LOC~~ SOAJ
2.5000 mg | SUBCUTANEOUS | 1 refills | Status: DC
  Filled 2024-05-02: qty 2, 28d supply, fill #0

## 2024-05-03 ENCOUNTER — Other Ambulatory Visit (HOSPITAL_COMMUNITY): Payer: Self-pay

## 2024-05-18 ENCOUNTER — Other Ambulatory Visit (HOSPITAL_COMMUNITY): Payer: Self-pay

## 2024-05-29 ENCOUNTER — Other Ambulatory Visit (HOSPITAL_COMMUNITY): Payer: Self-pay

## 2024-05-29 MED ORDER — LISDEXAMFETAMINE DIMESYLATE 30 MG PO CAPS
30.0000 mg | ORAL_CAPSULE | Freq: Every morning | ORAL | 0 refills | Status: DC
  Filled 2024-05-29: qty 30, 30d supply, fill #0

## 2024-05-29 MED ORDER — MELOXICAM 15 MG PO TABS
15.0000 mg | ORAL_TABLET | Freq: Every day | ORAL | 3 refills | Status: AC
  Filled 2024-05-29: qty 30, 30d supply, fill #0
  Filled 2024-07-09: qty 30, 30d supply, fill #1
  Filled 2024-08-14: qty 30, 30d supply, fill #2
  Filled 2024-10-18: qty 30, 30d supply, fill #3

## 2024-06-07 ENCOUNTER — Other Ambulatory Visit (HOSPITAL_COMMUNITY): Payer: Self-pay

## 2024-07-09 ENCOUNTER — Other Ambulatory Visit (HOSPITAL_COMMUNITY): Payer: Self-pay

## 2024-07-09 ENCOUNTER — Other Ambulatory Visit: Payer: Self-pay

## 2024-08-14 ENCOUNTER — Other Ambulatory Visit: Payer: Self-pay

## 2024-08-14 ENCOUNTER — Other Ambulatory Visit (HOSPITAL_COMMUNITY): Payer: Self-pay

## 2024-08-14 MED ORDER — BENZONATATE 200 MG PO CAPS
200.0000 mg | ORAL_CAPSULE | Freq: Three times a day (TID) | ORAL | 1 refills | Status: DC | PRN
  Filled 2024-08-14: qty 20, 7d supply, fill #0

## 2024-08-14 MED ORDER — DOXYCYCLINE MONOHYDRATE 100 MG PO CAPS
100.0000 mg | ORAL_CAPSULE | Freq: Two times a day (BID) | ORAL | 0 refills | Status: AC
  Filled 2024-08-14: qty 20, 10d supply, fill #0

## 2024-08-14 MED ORDER — PREDNISONE 10 MG (21) PO TBPK
ORAL_TABLET | ORAL | 0 refills | Status: DC
  Filled 2024-08-14: qty 21, 6d supply, fill #0

## 2024-08-14 MED ORDER — ALBUTEROL SULFATE HFA 108 (90 BASE) MCG/ACT IN AERS
2.0000 | INHALATION_SPRAY | RESPIRATORY_TRACT | 0 refills | Status: DC | PRN
  Filled 2024-08-14: qty 6.7, 25d supply, fill #0

## 2024-08-21 ENCOUNTER — Other Ambulatory Visit (HOSPITAL_COMMUNITY): Payer: Self-pay

## 2024-08-21 MED ORDER — BUDESONIDE-FORMOTEROL FUMARATE 160-4.5 MCG/ACT IN AERO
2.0000 | INHALATION_SPRAY | Freq: Two times a day (BID) | RESPIRATORY_TRACT | 1 refills | Status: AC
  Filled 2024-08-21: qty 10.2, 30d supply, fill #0

## 2024-08-31 ENCOUNTER — Other Ambulatory Visit (HOSPITAL_COMMUNITY): Payer: Self-pay

## 2024-09-14 ENCOUNTER — Other Ambulatory Visit (HOSPITAL_COMMUNITY): Payer: Self-pay

## 2024-09-15 ENCOUNTER — Other Ambulatory Visit (HOSPITAL_COMMUNITY): Payer: Self-pay

## 2024-10-18 ENCOUNTER — Other Ambulatory Visit (HOSPITAL_COMMUNITY): Payer: Self-pay

## 2024-10-18 MED ORDER — LISINOPRIL 40 MG PO TABS
40.0000 mg | ORAL_TABLET | Freq: Every day | ORAL | 0 refills | Status: DC
  Filled 2024-10-18: qty 30, 30d supply, fill #0

## 2024-10-18 MED ORDER — AMLODIPINE BESYLATE 10 MG PO TABS
10.0000 mg | ORAL_TABLET | Freq: Every day | ORAL | 0 refills | Status: AC
  Filled 2024-10-18: qty 30, 30d supply, fill #0
  Filled 2024-10-19: qty 30, 30d supply, fill #1

## 2024-10-19 ENCOUNTER — Other Ambulatory Visit (HOSPITAL_COMMUNITY): Payer: Self-pay

## 2024-10-24 ENCOUNTER — Other Ambulatory Visit (HOSPITAL_COMMUNITY): Payer: Self-pay

## 2024-10-24 MED ORDER — LISINOPRIL 40 MG PO TABS: 40.0000 mg | ORAL_TABLET | Freq: Every day | ORAL | 1 refills | Status: AC

## 2024-10-24 MED ORDER — AMLODIPINE BESYLATE 10 MG PO TABS: 10.0000 mg | ORAL_TABLET | Freq: Every day | ORAL | 1 refills | Status: AC
